# Patient Record
Sex: Female | Born: 2000 | Race: Black or African American | Hispanic: No | Marital: Single | Smoking: Never smoker
Health system: Southern US, Community
[De-identification: ages and names within clinical notes are randomized; demographics above are authoritative.]

## PROBLEM LIST (undated history)

## (undated) DIAGNOSIS — Z789 Other specified health status: Secondary | ICD-10-CM

## (undated) HISTORY — DX: Other specified health status: Z78.9

## (undated) HISTORY — PX: TONSILLECTOMY: SUR1361

---
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
## 2020-02-02 ENCOUNTER — Other Ambulatory Visit: Payer: Self-pay

## 2020-02-02 ENCOUNTER — Emergency Department: Payer: Managed Care, Other (non HMO)

## 2020-02-02 ENCOUNTER — Emergency Department
Admission: EM | Admit: 2020-02-02 | Discharge: 2020-02-02 | Disposition: A | Discharge status: Home / Self Care | Payer: Managed Care, Other (non HMO) | Attending: Emergency Medicine | Admitting: Emergency Medicine

## 2020-02-02 ENCOUNTER — Encounter: Payer: Self-pay | Admitting: Emergency Medicine

## 2020-02-02 DIAGNOSIS — R102 Pelvic and perineal pain: Secondary | ICD-10-CM | POA: Diagnosis present

## 2020-02-02 DIAGNOSIS — N83201 Unspecified ovarian cyst, right side: Secondary | ICD-10-CM | POA: Diagnosis not present

## 2020-02-02 DIAGNOSIS — R1011 Right upper quadrant pain: Secondary | ICD-10-CM | POA: Diagnosis not present

## 2020-02-02 DIAGNOSIS — R1010 Upper abdominal pain, unspecified: Secondary | ICD-10-CM

## 2020-02-02 DIAGNOSIS — N83209 Unspecified ovarian cyst, unspecified side: Secondary | ICD-10-CM

## 2020-02-02 LAB — COMPREHENSIVE METABOLIC PANEL
ALT: 25 U/L (ref 0–44)
AST: 22 U/L (ref 15–41)
Albumin: 4.1 g/dL (ref 3.5–5.0)
Alkaline Phosphatase: 54 U/L (ref 38–126)
Anion gap: 7 (ref 5–15)
BUN: 14 mg/dL (ref 6–20)
CO2: 22 mmol/L (ref 22–32)
Calcium: 8.6 mg/dL — ABNORMAL LOW (ref 8.9–10.3)
Chloride: 111 mmol/L (ref 98–111)
Creatinine, Ser: 0.81 mg/dL (ref 0.44–1.00)
GFR calc Af Amer: >60 mL/min (ref >60)
GFR calc non Af Amer: >60 mL/min (ref >60)
Glucose, Bld: 114 mg/dL — ABNORMAL HIGH (ref 70–99)
Potassium: 3.6 mmol/L (ref 3.5–5.1)
Sodium: 140 mmol/L (ref 135–145)
Total Bilirubin: 0.5 mg/dL (ref 0.3–1.2)
Total Protein: 7.1 g/dL (ref 6.5–8.1)

## 2020-02-02 LAB — URINALYSIS, COMPLETE (UACMP) WITH MICROSCOPIC
Bilirubin Urine: NEGATIVE
Glucose, UA: NEGATIVE mg/dL
Hgb urine dipstick: NEGATIVE
Ketones, ur: NEGATIVE mg/dL
Leukocytes,Ua: NEGATIVE
Nitrite: NEGATIVE
Protein, ur: 100 mg/dL — AB
Specific Gravity, Urine: 1.038 — ABNORMAL HIGH (ref 1.005–1.030)
Squamous Epithelial / LPF: NONE SEEN (ref 0–5)
pH: 5 (ref 5.0–8.0)

## 2020-02-02 LAB — CBC
HCT: 32.5 % — ABNORMAL LOW (ref 36.0–46.0)
Hemoglobin: 10.6 g/dL — ABNORMAL LOW (ref 12.0–15.0)
MCH: 29.1 pg (ref 26.0–34.0)
MCHC: 32.6 g/dL (ref 30.0–36.0)
MCV: 89.3 fL (ref 80.0–100.0)
Platelets: 313 10*3/uL (ref 150–400)
RBC: 3.64 MIL/uL — ABNORMAL LOW (ref 3.87–5.11)
RDW: 13.2 % (ref 11.5–15.5)
WBC: 7.8 10*3/uL (ref 4.0–10.5)
nRBC: 0 % (ref 0.0–0.2)

## 2020-02-02 LAB — LIPASE, BLOOD: Lipase: 20 U/L (ref 11–51)

## 2020-02-02 LAB — POCT PREGNANCY, URINE: Preg Test, Ur: NEGATIVE

## 2020-02-02 MED ORDER — ONDANSETRON HCL 4 MG/2ML IJ SOLN
4.0000 mg | Freq: Once | INTRAMUSCULAR | Status: AC
  Administered 2020-02-02: 4 mg via INTRAVENOUS
  Filled 2020-02-02: qty 2

## 2020-02-02 MED ORDER — HYDROCODONE-ACETAMINOPHEN 5-325 MG PO TABS: 1.0000 | ORAL_TABLET | Freq: Three times a day (TID) | ORAL | 0 refills | Status: AC | PRN

## 2020-02-02 MED ORDER — NAPROXEN 500 MG PO TABS: 500.0000 mg | ORAL_TABLET | Freq: Two times a day (BID) | ORAL | 2 refills | Status: DC

## 2020-02-02 MED ORDER — MORPHINE SULFATE (PF) 4 MG/ML IV SOLN
4.0000 mg | Freq: Once | INTRAVENOUS | Status: AC
  Administered 2020-02-02: 15:00:00 4 mg via INTRAVENOUS
  Filled 2020-02-02: qty 1

## 2020-02-02 MED ORDER — IOHEXOL 300 MG/ML  SOLN
100.0000 mL | Freq: Once | INTRAMUSCULAR | Status: AC | PRN
  Administered 2020-02-02: 15:00:00 100 mL via INTRAVENOUS
  Filled 2020-02-02: qty 100

## 2020-02-02 MED ORDER — SODIUM CHLORIDE 0.9 % IV SOLN
1000.0000 mL | Freq: Once | INTRAVENOUS | Status: AC
  Administered 2020-02-02: 15:00:00 1000 mL via INTRAVENOUS

## 2020-02-02 MED ORDER — KETOROLAC TROMETHAMINE 30 MG/ML IJ SOLN
30.0000 mg | Freq: Once | INTRAMUSCULAR | Status: AC
  Administered 2020-02-02: 14:00:00 30 mg via INTRAMUSCULAR
  Filled 2020-02-02: qty 1

## 2020-02-02 MED ORDER — SODIUM CHLORIDE 0.9% FLUSH: 3.0000 mL | Freq: Once | INTRAVENOUS | Status: DC

## 2020-02-02 NOTE — ED Triage Notes (Signed)
Abdominal pain since 0200.  Now c/o worsening pain with inspiration.

## 2020-02-02 NOTE — ED Notes (Signed)
Pt verbalized understanding of discharge instructions. NAD at this time. 

## 2020-02-02 NOTE — ED Provider Notes (Signed)
[facility] Emergency Department Provider Note   ____________________________________________    I have reviewed the triage vital signs and the nursing notes.   HISTORY  Chief Complaint Abdominal Pain     HPI Brooke Love is a 19 y.o. female who presents with complaints of abdominal pain.  Patient describes bilateral pelvic cramping discomfort which she describes as severe menstrual-like cramping, however not currently menstruating.  Then she developed pain in her upper abdomen.  The symptoms started over the last 1 to 2 days.  Denies fevers or chills.  No dysuria.  No vaginal discharge.  No vaginal bleeding.  Normal stools.  No sick contacts.  Has not take anything for this.  Is never had this before.  No history of abdominal surgeries.  Patient reports she was just checked for STDs 1 month ago, she has no concern about STDs  History reviewed. No pertinent past medical history.  There are no problems to display for this patient.   Past Surgical History:  Procedure Laterality Date  . TONSILLECTOMY      Prior to Admission medications   Medication Sig Start Date End Date Taking? Authorizing Provider  cyclobenzaprine (FLEXERIL) 5 MG tablet Take 2 tablets (10 mg total) by mouth 3 (three) times daily as needed for muscle spasms. 01/21/16   Anson Oregon, PA-C  HYDROcodone-acetaminophen (NORCO) 5-325 MG tablet Take 1 tablet by mouth 3 (three) times daily as needed for up to 2 days. 02/02/20 02/04/20  Menshew, Charlesetta Ivory, PA-C  naproxen (NAPROSYN) 500 MG tablet Take 1 tablet (500 mg total) by mouth 2 (two) times daily with a meal. 02/02/20   Jene Every, MD     Allergies Amoxicillin  No family history on file.  Social History Social History   Tobacco Use  . Smoking status: Never Smoker  . Smokeless tobacco: Never Used  Substance Use Topics  . Alcohol use: No  . Drug use: Not on file    Review of Systems  Constitutional: No  fever/chills Eyes: No visual changes.  ENT: No sore throat. Cardiovascular: Denies chest pain. Respiratory: Denies shortness of breath. Gastrointestinal: As above Genitourinary: As above Musculoskeletal: Negative for back pain. Skin: Negative for rash. Neurological: Negative for headaches or weakness   ____________________________________________   PHYSICAL EXAM:  VITAL SIGNS: ED Triage Vitals  Enc Vitals Group     BP 02/02/20 1004 132/83     Pulse Rate 02/02/20 1004 71     Resp 02/02/20 1004 18     Temp 02/02/20 1004 98.3 F (36.8 C)     Temp Source 02/02/20 1004 Oral     SpO2 02/02/20 1004 98 %     Weight 02/02/20 1002 65.8 kg (145 lb 1 oz)     Height 02/02/20 1002 1.499 m (4\' 11" )     Head Circumference --      Peak Flow --      Pain Score 02/02/20 1002 7     Pain Loc --      Pain Edu? --      Excl. in GC? --     Constitutional: Alert and oriented. e  Nose: No congestion/rhinnorhea. Mouth/Throat: Mucous membranes are moist.    Cardiovascular: Normal rate, regular rhythm. Grossly normal heart sounds.   Respiratory: Normal respiratory effort.  No retractions. Lungs CTAB. Gastrointestinal: Soft, tenderness over the right upper quadrant, no distention.  No CVA tenderness  Musculoskeletal: No lower extremity tenderness nor edema.  Warm and well  perfused Neurologic:  Normal speech and language. No gross focal neurologic deficits are appreciated.  Skin:  Skin is warm, dry and intact. No rash noted. Psychiatric: Mood and affect are normal. Speech and behavior are normal.  ____________________________________________   LABS (all labs ordered are listed, but only abnormal results are displayed)  Labs Reviewed  COMPREHENSIVE METABOLIC PANEL - Abnormal; Notable for the following components:      Result Value   Glucose, Bld 114 (*)    Calcium 8.6 (*)    All other components within normal limits  CBC - Abnormal; Notable for the following components:   RBC 3.64 (*)     Hemoglobin 10.6 (*)    HCT 32.5 (*)    All other components within normal limits  URINALYSIS, COMPLETE (UACMP) WITH MICROSCOPIC - Abnormal; Notable for the following components:   Color, Urine YELLOW (*)    APPearance TURBID (*)    Specific Gravity, Urine 1.038 (*)    Protein, ur 100 (*)    Bacteria, UA MANY (*)    All other components within normal limits  LIPASE, BLOOD  POC URINE PREG, ED  POCT PREGNANCY, URINE   ____________________________________________  EKG  None ____________________________________________  RADIOLOGY  Ultrasound abdomen of the right upper quadrant demonstrates small amount of free fluid noted around the liver, gallbladder normal CT scan consistent with ruptured hemorrhagic cyst, small amount of hemoperitoneum ____________________________________________   PROCEDURES  Procedure(s) performed: No  Procedures   Critical Care performed: No ____________________________________________   INITIAL IMPRESSION / ASSESSMENT AND PLAN / ED COURSE  Pertinent labs & imaging results that were available during my care of the patient were reviewed by me and considered in my medical decision making (see chart for details).  Patient presents with pain as described above.  On exam primarily tender over the right upper quadrant, cholelithiasis/cholecystitis is a possibility although she is somewhat young for that.  No dysuria or vaginal discharge however Rochele Raring on the differential.  We will start with ultrasound of the upper abdomen, treated with IM Toradol.  Ultrasound demonstrates small amount of free fluid noted on the liver.  Rochele Raring remains on the differential but also ruptured ovarian cyst.  Will send for CT abdomen pelvis.  Vital signs stable.  IV morphine and IV Zofran given for worsening pain.  CT scan consistent with ruptured ovarian cyst, small amount of hemoperitoneum, patient feeling much better, vital signs remained stable.   Discussed with Dr. Marcelline Mates of gynecology who recommends close outpatient follow-up in her office, discussed with patient and her mother who agree with this plan.  They know to return to the emergency department if any worsening symptoms, dizziness lightheadedness or worsening abdominal pain.    ____________________________________________   FINAL CLINICAL IMPRESSION(S) / ED DIAGNOSES  Final diagnoses:  Upper abdominal pain  Ruptured ovarian cyst        Note:  This document was prepared using Dragon voice recognition software and may include unintentional dictation errors.   Lavonia Drafts, MD 02/02/20 1630

## 2020-02-02 NOTE — ED Notes (Signed)
Pt to ct 

## 2020-02-04 ENCOUNTER — Other Ambulatory Visit: Payer: Self-pay

## 2020-02-04 ENCOUNTER — Ambulatory Visit (INDEPENDENT_AMBULATORY_CARE_PROVIDER_SITE_OTHER): Payer: Managed Care, Other (non HMO) | Admitting: Obstetrics and Gynecology

## 2020-02-04 ENCOUNTER — Encounter: Payer: Self-pay | Admitting: Obstetrics and Gynecology

## 2020-02-04 VITALS — BP 130/81 | HR 91 | Ht 59.0 in | Wt 170.1 lb

## 2020-02-04 DIAGNOSIS — N83209 Unspecified ovarian cyst, unspecified side: Secondary | ICD-10-CM

## 2020-02-04 DIAGNOSIS — D500 Iron deficiency anemia secondary to blood loss (chronic): Secondary | ICD-10-CM

## 2020-02-04 NOTE — Progress Notes (Signed)
GYNECOLOGY PROGRESS NOTE  Subjective:    Patient ID: Brooke Love, female    DOB: 01/03/2001, 19 y.o.   MRN: [id number]  HPI  Patient is a 19 y.o. G0P0000 female who presents for follow up of Emergency Room visit (02/02/2020) for complaints of abdominal pain x 1-2 days.  She was diagnosed with a ruptured ovarian cyst.  Patient states that at that time she had not taken anything for the pain.  She does not have a prior history of ovarian cysts in the pasts.  Reports regular menses.  Since her visit, she notes that the pain has improved, only having to take OTC meds as needed.    Past Medical History:  Diagnosis Date  . No pertinent past medical history     Family History  Problem Relation Age of Onset  . Healthy Mother   . Healthy Father   . Cancer Maternal Grandmother   . Breast cancer Maternal Grandmother     Past Surgical History:  Procedure Laterality Date  . TONSILLECTOMY      Social History   Socioeconomic History  . Marital status: Single    Spouse name: Not on file  . Number of children: Not on file  . Years of education: Not on file  . Highest education level: Not on file  Occupational History  . Not on file  Tobacco Use  . Smoking status: Never Smoker  . Smokeless tobacco: Never Used  Substance and Sexual Activity  . Alcohol use: Yes    Comment: occass  . Drug use: Not Currently    Types: Marijuana  . Sexual activity: Yes    Birth control/protection: Condom  Other Topics Concern  . Not on file  Social History Narrative  . Not on file   Social Determinants of Health   Financial Resource Strain:   . Difficulty of Paying Living Expenses:   Food Insecurity:   . Worried About Programme researcher, broadcasting/film/video in the Last Year:   . Barista in the Last Year:   Transportation Needs:   . Freight forwarder (Medical):   Marland Kitchen Lack of Transportation (Non-Medical):   Physical Activity:   . Days of Exercise per Week:   . Minutes of Exercise per Session:     Stress:   . Feeling of Stress :   Social Connections:   . Frequency of Communication with Friends and Family:   . Frequency of Social Gatherings with Friends and Family:   . Attends Religious Services:   . Active Member of Clubs or Organizations:   . Attends Banker Meetings:   Marland Kitchen Marital Status:   Intimate Partner Violence:   . Fear of Current or Ex-Partner:   . Emotionally Abused:   Marland Kitchen Physically Abused:   . Sexually Abused:     Current Outpatient Medications on File Prior to Visit  Medication Sig Dispense Refill  . naproxen (NAPROSYN) 500 MG tablet Take 1 tablet (500 mg total) by mouth 2 (two) times daily with a meal. 20 tablet 2   No current facility-administered medications on file prior to visit.    Allergies  Allergen Reactions  . Amoxicillin Rash     Review of Systems Constitutional: negative for chills, fatigue, fevers and sweats Eyes: negative for irritation, redness and visual disturbance Ears, nose, mouth, throat, and face: negative for hearing loss, nasal congestion, snoring and tinnitus Respiratory: negative for asthma, cough, sputum Cardiovascular: negative for chest pain, dyspnea, exertional chest  pressure/discomfort, irregular heart beat, palpitations and syncope Gastrointestinal: positive for abdominal pain (resolving).  Negative for change in bowel habits, nausea and vomiting Genitourinary: negative for abnormal menstrual periods, genital lesions, sexual problems and vaginal discharge, dysuria and urinary incontinence Integument/breast: negative for breast lump, breast tenderness and nipple discharge Hematologic/lymphatic: negative for bleeding and easy bruising Musculoskeletal:negative for back pain and muscle weakness Neurological: negative for dizziness, headaches, vertigo and weakness Endocrine: negative for diabetic symptoms including polydipsia, polyuria and skin dryness Allergic/Immunologic: negative for hay fever and urticaria       Objective:   Blood pressure 130/81, pulse 91, height 4\' 11"  (1.499 m), weight 170 lb 1.6 oz (77.2 kg), last menstrual period 02/03/2020. General appearance: alert and no distress  CVS: S1 and S2 normal. Regular rate and rhythm. Lungs: CTAB  Abdomen: soft, non-tender; bowel sounds normal; no masses,  no organomegaly Pelvic: deferred.  Extremities: extremities normal, atraumatic, no cyanosis or edema Neurologic: Grossly normal    Labs:  Admission on 02/02/2020, Discharged on 02/02/2020  Component Date Value Ref Range Status  . Lipase 02/02/2020 20  11 - 51 U/L Final   Performed at [facility], [address] [place] [place], [place]  . Sodium 02/02/2020 140  135 - 145 mmol/L Final  . Potassium 02/02/2020 3.6  3.5 - 5.1 mmol/L Final  . Chloride 02/02/2020 111  98 - 111 mmol/L Final  . CO2 02/02/2020 22  22 - 32 mmol/L Final  . Glucose, Bld 02/02/2020 114* 70 - 99 mg/dL Final   Glucose reference range applies only to samples taken after fasting for at least 8 hours.  . BUN 02/02/2020 14  6 - 20 mg/dL Final  . Creatinine, Ser 02/02/2020 0.81  0.44 - 1.00 mg/dL Final  . Calcium 02/04/2020 8.6* 8.9 - 10.3 mg/dL Final  . Total Protein 02/02/2020 7.1  6.5 - 8.1 g/dL Final  . Albumin 02/04/2020 4.1  3.5 - 5.0 g/dL Final  . AST 60/07/9322 22  15 - 41 U/L Final  . ALT 02/02/2020 25  0 - 44 U/L Final  . Alkaline Phosphatase 02/02/2020 54  38 - 126 U/L Final  . Total Bilirubin 02/02/2020 0.5  0.3 - 1.2 mg/dL Final  . GFR calc non Af Amer 02/02/2020 >60  >60 mL/min Final  . GFR calc Af Amer 02/02/2020 >60  >60 mL/min Final  . Anion gap 02/02/2020 7  5 - 15 Final   Performed at [facility], [address], [place], [place]  . WBC 02/02/2020 7.8  4.0 - 10.5 K/uL Final  . RBC 02/02/2020 3.64* 3.87 - 5.11 MIL/uL Final  . Hemoglobin 02/02/2020 10.6* 12.0 - 15.0 g/dL Final  . HCT 02/04/2020 32.5* 36.0 - 46.0 % Final  . MCV 02/02/2020 89.3  80.0 - 100.0 fL  Final  . MCH 02/02/2020 29.1  26.0 - 34.0 pg Final  . MCHC 02/02/2020 32.6  30.0 - 36.0 g/dL Final  . RDW 02/04/2020 13.2  11.5 - 15.5 % Final  . Platelets 02/02/2020 313  150 - 400 K/uL Final  . nRBC 02/02/2020 0.0  0.0 - 0.2 % Final   Performed at [facility], [address] [place], [place]  . Color, Urine 02/02/2020 YELLOW* YELLOW Final  . APPearance 02/02/2020 TURBID* CLEAR Final  . Specific Gravity, Urine 02/02/2020 1.038* 1.005 - 1.030 Final  . pH 02/02/2020 5.0  5.0 - 8.0 Final  . Glucose, UA 02/02/2020 NEGATIVE  NEGATIVE mg/dL Final  .  Hgb urine dipstick 02/02/2020 NEGATIVE  NEGATIVE Final  . Bilirubin Urine 02/02/2020 NEGATIVE  NEGATIVE Final  . Ketones, ur 02/02/2020 NEGATIVE  NEGATIVE mg/dL Final  . Protein, ur 22/97/9892 100* NEGATIVE mg/dL Final  . Nitrite 11/94/1740 NEGATIVE  NEGATIVE Final  . Glori Luis 02/02/2020 NEGATIVE  NEGATIVE Final  . RBC / HPF 02/02/2020 6-10  0 - 5 RBC/hpf Final  . WBC, UA 02/02/2020 6-10  0 - 5 WBC/hpf Final  . Bacteria, UA 02/02/2020 MANY* NONE SEEN Final  . Squamous Epithelial / LPF 02/02/2020 NONE SEEN  0 - 5 Final  . Mucus 02/02/2020 PRESENT   Final   Performed at [facility], [address], [place], [place] [address]  . Preg Test, Ur 02/02/2020 NEGATIVE  NEGATIVE Final   Comment:        THE SENSITIVITY OF THIS METHODOLOGY IS >24 mIU/mL     Imaging:  CT ABDOMEN PELVIS W CONTRAST CLINICAL DATA:  Acute right upper quadrant abdominal pain.  EXAM: CT ABDOMEN AND PELVIS WITH CONTRAST  TECHNIQUE: Multidetector CT imaging of the abdomen and pelvis was performed using the standard protocol following bolus administration of intravenous contrast.  CONTRAST:  OMNIPAQUE IOHEXOL 300 MG/ML  SOLN  COMPARISON:  Right upper quadrant ultrasound from same day.  FINDINGS: Lower chest: No acute abnormality.  Hepatobiliary: No focal liver abnormality is seen. No gallstones, gallbladder wall  thickening, or biliary dilatation.  Pancreas: Unremarkable. No pancreatic ductal dilatation or surrounding inflammatory changes.  Spleen: Normal in size without focal abnormality.  Adrenals/Urinary Tract: Adrenal glands are unremarkable. Kidneys are normal, without renal calculi, focal lesion, or hydronephrosis. Bladder is unremarkable.  Stomach/Bowel: The stomach is within normal limits. Mild wall thickening of a few small bowel loops in the pelvis with fecalization. No obstruction. Normal appendix.  Vascular/Lymphatic: No significant vascular findings are present. No enlarged abdominal or pelvic lymph nodes.  Reproductive: 3.5 x 2.9 x 5.8 cm right ovarian complex cystic lesion containing high density material. The uterus and left ovary are unremarkable.  Other: Small amount of high-density fluid in the pelvis and around the liver and spleen. No pneumoperitoneum.  Musculoskeletal: No acute or significant osseous findings.  IMPRESSION: 1. 5.8 cm complex cystic lesion in the right ovary containing high density material, with small amount of hemoperitoneum in the pelvis and around the liver and spleen, consistent with ruptured hemorrhagic cyst. 2. Reactive small bowel enteritis and stasis in the pelvis adjacent to the ruptured ovarian cyst.  Electronically Signed   By: Obie Dredge M.D.   On: 02/02/2020 15:46 US Abdomen Limited RUQ CLINICAL DATA:  Acute upper abdominal pain.  EXAM: ULTRASOUND ABDOMEN LIMITED RIGHT UPPER QUADRANT  COMPARISON:  None.  FINDINGS: Gallbladder:  No gallstones or wall thickening visualized. No sonographic Murphy sign noted by sonographer.  Common bile duct:  Diameter: 3 mm which is within normal limits.  Liver:  No focal lesion identified. Within normal limits in parenchymal echogenicity. Portal vein is patent on color Doppler imaging with normal direction of blood flow towards the liver.  Other: Small amount of free fluid is  noted around the liver.  IMPRESSION: Small amount of free fluid is noted around the liver. No other abnormality is noted in the right upper quadrant of the abdomen.  Electronically Signed   By: Lupita Raider M.D.   On: 02/02/2020 14:29   Assessment:   Ruptured right hemorrhagic cyst Anemia   Plan:   - Patient overall noting improvement in her symptoms,  has been doing well since ER visit 2 days ago.  Her Hemoglobin was borderline low, unsure if it is due to acute blood loss from ruptured hemorrhagic cyst or if she has a baseline history of anemia (as no previous CBC noted in the system) due to menses.  Unable to repeat CBC today as patient had late arrival for appointment and lab technician gone for the day. Patient overall appears stable with improving symptoms, so less concerned about continued blood loss. Can continue OTC pain meds as needed.  - Review of chart notes that patient has actually been seen by [facility] and was not actually unassigned. Advised that she can f/u with them for any future issues, or can continue to be seen at [facility] if she desires.    Rubie Maid, MD [facility]

## 2020-02-04 NOTE — Patient Instructions (Addendum)
Ovarian Cyst     An ovarian cyst is a fluid-filled sac that forms on an ovary. The ovaries are small organs that produce eggs in women. Various types of cysts can form on the ovaries. Some may cause symptoms and require treatment. Most ovarian cysts go away on their own, are not cancerous (are benign), and do not cause problems. Common types of ovarian cysts include:  Functional (follicle) cysts. ? Occur during the menstrual cycle, and usually go away with the next menstrual cycle if you do not get pregnant. ? Usually cause no symptoms.  Endometriomas. ? Are cysts that form from the tissue that lines the uterus (endometrium). ? Are sometimes called "chocolate cysts" because they become filled with blood that turns brown. ? Can cause pain in the lower abdomen during intercourse and during your period.  Cystadenoma cysts. ? Develop from cells on the outside surface of the ovary. ? Can get very large and cause lower abdomen pain and pain with intercourse. ? Can cause severe pain if they twist or break open (rupture).  Dermoid cysts. ? Are sometimes found in both ovaries. ? May contain different kinds of body tissue, such as skin, teeth, hair, or cartilage. ? Usually do not cause symptoms unless they get very big.  Theca lutein cysts. ? Occur when too much of a certain hormone (human chorionic gonadotropin) is produced and overstimulates the ovaries to produce an egg. ? Are most common after having procedures used to assist with the conception of a baby (in vitro fertilization). What are the causes? Ovarian cysts may be caused by:  Ovarian hyperstimulation syndrome. This is a condition that can develop from taking fertility medicines. It causes multiple large ovarian cysts to form.  Polycystic ovarian syndrome (PCOS). This is a common hormonal disorder that can cause ovarian cysts, as well as problems with your period or fertility. What increases the risk? The following factors may  make you more likely to develop ovarian cysts:  Being overweight or obese.  Taking fertility medicines.  Taking certain forms of hormonal birth control.  Smoking. What are the signs or symptoms? Many ovarian cysts do not cause symptoms. If symptoms are present, they may include:  Pelvic pain or pressure.  Pain in the lower abdomen.  Pain during sex.  Abdominal swelling.  Abnormal menstrual periods.  Increasing pain with menstrual periods. How is this diagnosed? These cysts are commonly found during a routine pelvic exam. You may have tests to find out more about the cyst, such as:  Ultrasound.  X-ray of the pelvis.  CT scan.  MRI.  Blood tests. How is this treated? Many ovarian cysts go away on their own without treatment. Your health care provider may want to check your cyst regularly for 2-3 months to see if it changes. If you are in menopause, it is especially important to have your cyst monitored closely because menopausal women have a higher rate of ovarian cancer. When treatment is needed, it may include:  Medicines to help relieve pain.  A procedure to drain the cyst (aspiration).  Surgery to remove the whole cyst.  Hormone treatment or birth control pills. These methods are sometimes used to help dissolve a cyst. Follow these instructions at home:  Take over-the-counter and prescription medicines only as told by your health care provider.  Do not drive or use heavy machinery while taking prescription pain medicine.  Get regular pelvic exams and Pap tests as often as told by your health care provider.    Return to your normal activities as told by your health care provider. Ask your health care provider what activities are safe for you.  Do not use any products that contain nicotine or tobacco, such as cigarettes and e-cigarettes. If you need help quitting, ask your health care provider.  Keep all follow-up visits as told by your health care provider.  This is important. Contact a health care provider if:  Your periods are late, irregular, or painful, or they stop.  You have pelvic pain that does not go away.  You have pressure on your bladder or trouble emptying your bladder completely.  You have pain during sex.  You have any of the following in your abdomen: ? A feeling of fullness. ? Pressure. ? Discomfort. ? Pain that does not go away. ? Swelling.  You feel generally ill.  You become constipated.  You lose your appetite.  You develop severe acne.  You start to have more body hair and facial hair.  You are gaining weight or losing weight without changing your exercise and eating habits.  You think you may be pregnant. Get help right away if:  You have abdominal pain that is severe or gets worse.  You cannot eat or drink without vomiting.  You suddenly develop a fever.  Your menstrual period is much heavier than usual. This information is not intended to replace advice given to you by your health care provider. Make sure you discuss any questions you have with your health care provider. Document Revised: 01/06/2018 Document Reviewed: 03/11/2016 Elsevier Patient Education  2020 [employer].  Ovarian Cyst An ovarian cyst is a fluid-filled sac on an ovary. The ovaries are organs that make eggs in women. Most ovarian cysts go away on their own and are not cancerous (are benign). Some cysts need treatment. Follow these instructions at home:  Take over-the-counter and prescription medicines only as told by your doctor.  Do not drive or use heavy machinery while taking prescription pain medicine.  Get pelvic exams and Pap tests as often as told by your doctor.  Return to your normal activities as told by your doctor. Ask your doctor what activities are safe for you.  Do not use any products that contain nicotine or tobacco, such as cigarettes and e-cigarettes. If you need help quitting, ask your doctor.  Keep  all follow-up visits as told by your doctor. This is important. Contact a doctor if:  Your periods are: ? Late. ? Irregular. ? Painful.   Your periods stop.  You have pelvic pain that does not go away.  You have pressure on your bladder.  You have trouble making your bladder empty when you pee (urinate).  You have pain during sex.  You have any of the following in your belly (abdomen): ? A feeling of fullness. ? Pressure. ? Discomfort. ? Pain that does not go away. ? Swelling.  You feel sick most of the time.  You have trouble pooping (have constipation).  You are not as hungry as usual (you lose your appetite).  You get very bad acne.  You start to have more hair on your body and face.  You are gaining weight or losing weight without changing your exercise and eating habits.  You think you may be pregnant. Get help right away if:  You have belly pain that is very bad or gets worse.  You cannot eat or drink without throwing up (vomiting).  You suddenly get a fever.  Your period  is a lot heavier than usual. This information is not intended to replace advice given to you by your health care provider. Make sure you discuss any questions you have with your health care provider. Document Revised: 09/20/2017 Document Reviewed: 03/11/2016 Elsevier Patient Education  2020 [employer].

## 2020-02-04 NOTE — Progress Notes (Signed)
Pt present for ED follow up after 3 days of ovarian cyst pain. Pt's LMP 02/03/2020.

## 2020-02-08 ENCOUNTER — Encounter: Payer: Self-pay | Admitting: Obstetrics and Gynecology

## 2021-02-20 ENCOUNTER — Telehealth: Payer: Self-pay | Admitting: Obstetrics and Gynecology

## 2021-02-20 NOTE — Telephone Encounter (Signed)
Pt's mother called to schedule appointment states that her periods has been going on for 2 weeks, I gave Dr.Cherry's first available appoint that was this Thursday at 05-05 @ 2:45 - pt's mother made apt but wanted to know if it was okay to wait til later in week or needs to be seen sooner. Please Advise.

## 2021-02-21 NOTE — Telephone Encounter (Signed)
Spoke to pt concerning her call to the office. Pt stated that she was still having issues and is okay to wait until Thursday 02/23/2021 to be seen. Pt is aware that if anyone cancels that I would reach out to her to see if she can come in. Pt voiced that she understood.

## 2021-02-23 ENCOUNTER — Encounter: Payer: Self-pay | Admitting: Obstetrics and Gynecology

## 2021-02-23 ENCOUNTER — Ambulatory Visit (INDEPENDENT_AMBULATORY_CARE_PROVIDER_SITE_OTHER): Payer: Managed Care, Other (non HMO) | Admitting: Obstetrics and Gynecology

## 2021-02-23 ENCOUNTER — Other Ambulatory Visit: Payer: Self-pay

## 2021-02-23 VITALS — BP 137/83 | HR 92 | Ht 59.0 in | Wt 172.8 lb

## 2021-02-23 DIAGNOSIS — N921 Excessive and frequent menstruation with irregular cycle: Secondary | ICD-10-CM

## 2021-02-23 LAB — POCT URINE PREGNANCY: Preg Test, Ur: NEGATIVE

## 2021-02-23 MED ORDER — MEDROXYPROGESTERONE ACETATE 10 MG PO TABS: 10.0000 mg | ORAL_TABLET | Freq: Every day | ORAL | 0 refills | Status: AC

## 2021-02-23 NOTE — Patient Instructions (Signed)
Dysfunctional Uterine Bleeding Dysfunctional uterine bleeding is abnormal bleeding from the uterus. Dysfunctional uterine bleeding includes:  A menstrual period that comes earlier or later than usual.  A menstrual period that is lighter or heavier than usual, or has large blood clots.  Vaginal bleeding between menstrual periods.  Skipping one or more menstrual periods.  Vaginal bleeding after sex.  Vaginal bleeding after menopause. Follow these instructions at home: Eating and drinking  Eat well-balanced meals. Include foods that are high in iron, such as liver, meat, shellfish, green leafy vegetables, and eggs.  To prevent or treat constipation, your health care provider may recommend that you: ? Drink enough fluid to keep your urine pale yellow. ? Take over-the-counter or prescription medicines. ? Eat foods that are high in fiber, such as beans, whole grains, and fresh fruits and vegetables. ? Limit foods that are high in fat and processed sugars, such as fried or sweet foods.   Medicines  Take over-the-counter and prescription medicines only as told by your health care provider.  Do not change medicines without talking with your health care provider.  Aspirin or medicines that contain aspirin may make the bleeding worse. Do not take those medicines: ? During the week before your menstrual period. ? During your menstrual period.  If you were prescribed iron pills, take them as told by your health care provider. Iron pills help to replace iron that your body loses because of this condition. Activity  If you need to change your sanitary pad or tampon more than one time every 2 hours: ? Lie in bed with your feet raised (elevated). ? Place a cold pack on your lower abdomen. ? Rest as much as possible until the bleeding stops or slows down.  Do not try to lose weight until the bleeding has stopped and your blood iron level is back to normal. General instructions  For two  months, write down: ? When your menstrual period starts. ? When your menstrual period ends. ? When any abnormal vaginal bleeding occurs. ? What problems you notice.  Keep all follow up visits as told by your health care provider. This is important.   Contact a health care provider if you:  Feel light-headed or weak.  Have nausea and vomiting.  Cannot eat or drink without vomiting.  Feel dizzy or have diarrhea while you are taking medicines.  Are taking birth control pills or hormones, and you want to change them or stop taking them. Get help right away if:  You develop a fever or chills.  You need to change your sanitary pad or tampon more than one time per hour.  Your vaginal bleeding becomes heavier, or your flow contains clots more often.  You develop pain in your abdomen.  You lose consciousness.  You develop a rash. Summary  Dysfunctional uterine bleeding is abnormal bleeding from the uterus.  It includes menstrual bleeding of abnormal duration, volume, or regularity.  Bleeding after sex and after menopause are also considered dysfunctional uterine bleeding. This information is not intended to replace advice given to you by your health care provider. Make sure you discuss any questions you have with your health care provider. Document Revised: 03/19/2018 Document Reviewed: 03/19/2018 Elsevier Patient Education  2021 [employer]  

## 2021-02-23 NOTE — Progress Notes (Signed)
Pt present due to having irregular prolonged cycles. Pt stated her last cycle started 01/30/2021 and still present. Pt stated having light cycles with clots.

## 2021-02-23 NOTE — Progress Notes (Signed)
    GYNECOLOGY PROGRESS NOTE  Subjective:    Patient ID: Brooke Love, female    DOB: [birth year], 20 y.o.   MRN: [id number]  HPI  Patient is a 20 y.o. G0P0000 female who presents for complaints of prolonged menstrual cycle.  Notes that her current cycle has been on for the past 2 weeks, with associated clots. Has never had any issues with her cycles in the past.  She is currently sexually active, uses condoms for contraception. Denies any recent issues with condom breakage. Denies vaginal discharge or pelvic pain/cramping. Also denies any dizziness, SOB.  She does report an increase in stress levels this week as she is taking final exams.   The following portions of the patient's history were reviewed and updated as appropriate: allergies, current medications, past family history, past medical history, past social history, past surgical history and problem list.  Review of Systems Pertinent items noted in HPI and remainder of comprehensive ROS otherwise negative.   Objective:   Blood pressure 137/83, pulse 92, height 4\' 11"  (1.499 m), weight 172 lb 12.8 oz (78.4 kg), last menstrual period 01/30/2021. General appearance: alert and no distress Abdomen: soft, non-tender; bowel sounds normal; no masses,  no organomegaly Pelvic: deferred   Labs:   , Results for orders placed or performed in visit on 02/23/21  POCT urine pregnancy  Result Value Ref Range   Preg Test, Ur Negative Negative    Assessment:   Prolonged menstrual cycle  Plan:   Unclear cause of prolonged menses. Is first occurrence. Discussed differential including stress, dietary changes, hormonal changes, etc.  Discussed stress relief. Reports that today her cycle feels like it may be slowing down. Advised that we can perform expectant management for now, or can treat with progesterone or 1 month of OCPs. Patient ok for expectant management but requests progesterone prescription in case bleeding persists or worsens.    04/25/21, MD [facility]

## 2021-08-26 IMAGING — US US ABDOMEN LIMITED
1 series · 14 of 25 positions shown · non-contrast
Comparison: None.

CLINICAL DATA: Acute upper abdominal pain.

EXAM:
ULTRASOUND ABDOMEN LIMITED RIGHT UPPER QUADRANT

[Series 1: us abdomen limited ruq · 14 of 37 slices shown]
[im 1/37]
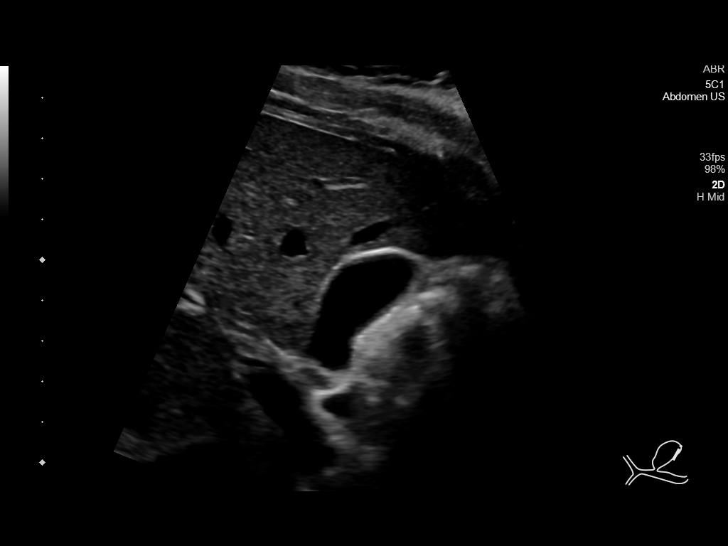
[im 4/37]
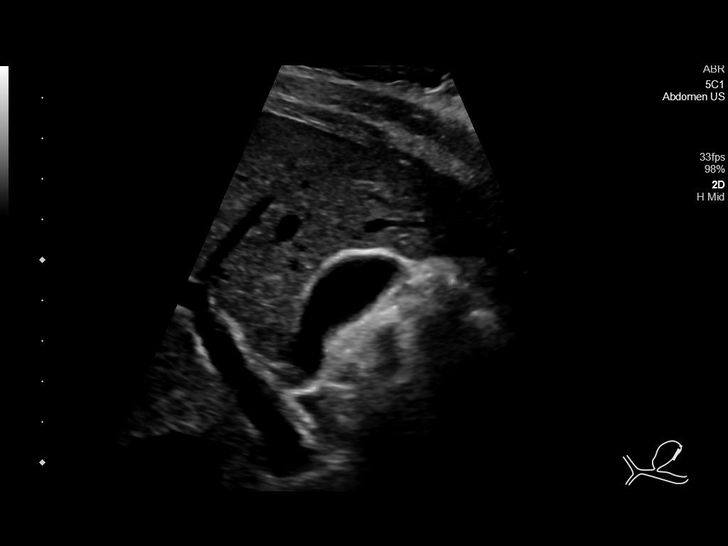
[im 7/37]
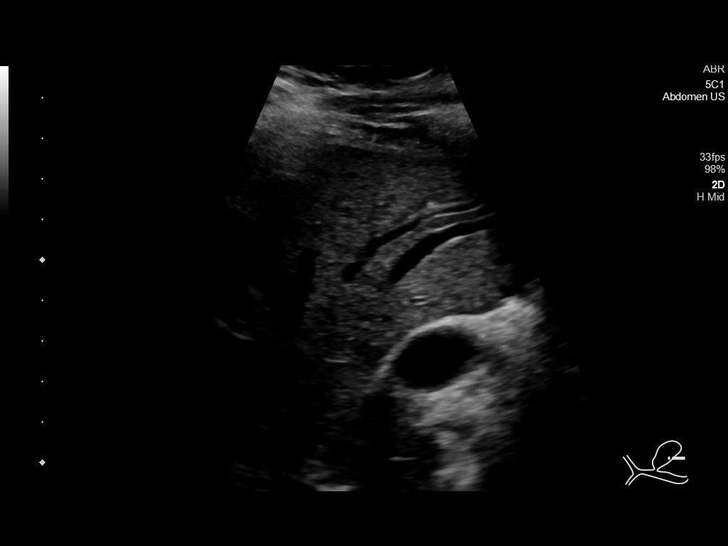
[im 10/37]
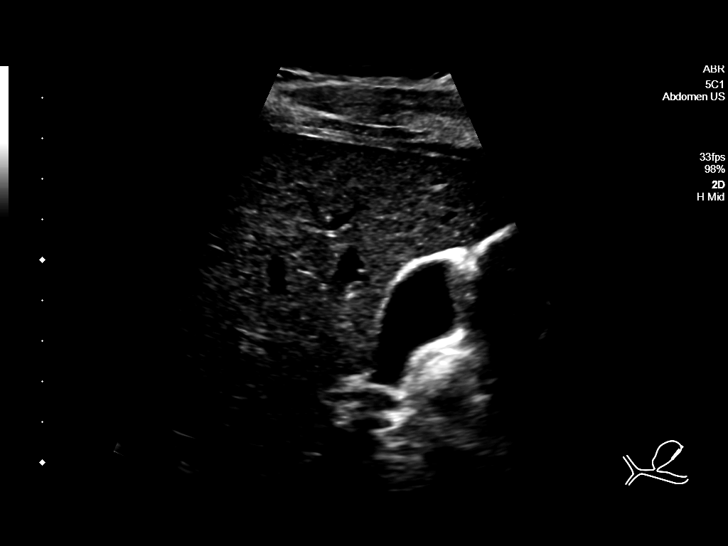
[im 13/37]
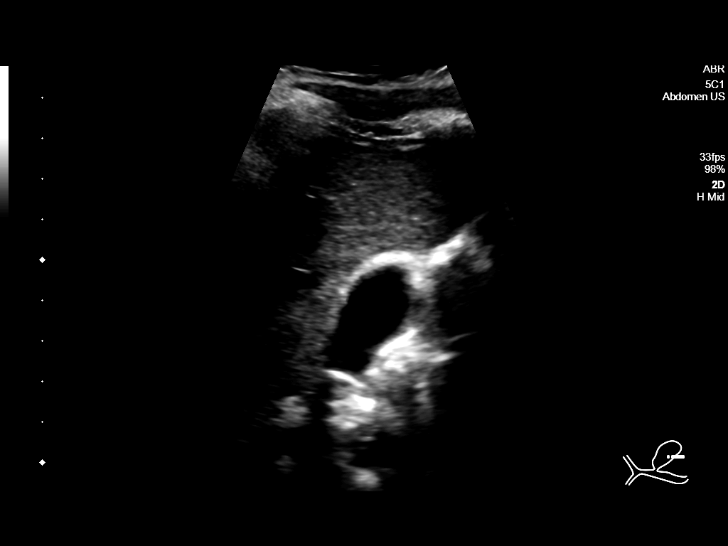
[im 14/37]
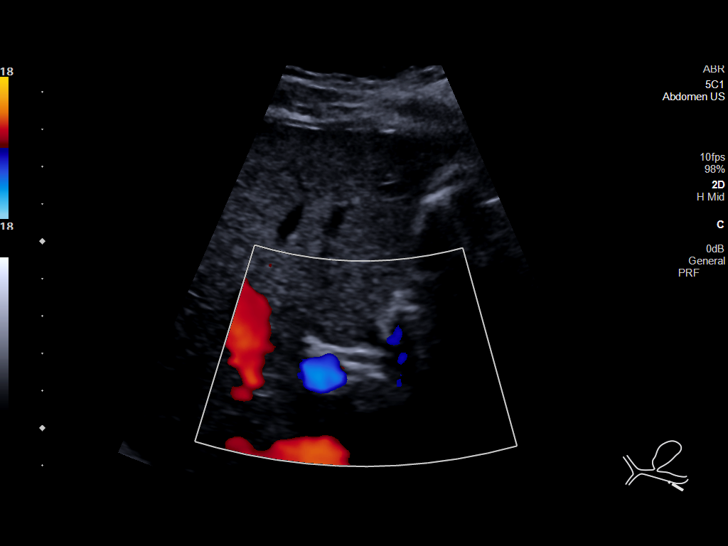
[im 17/37]
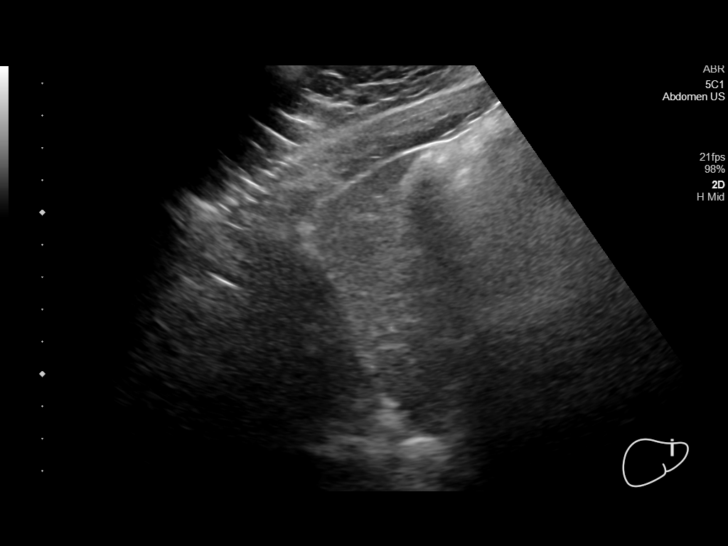
[im 20/37]
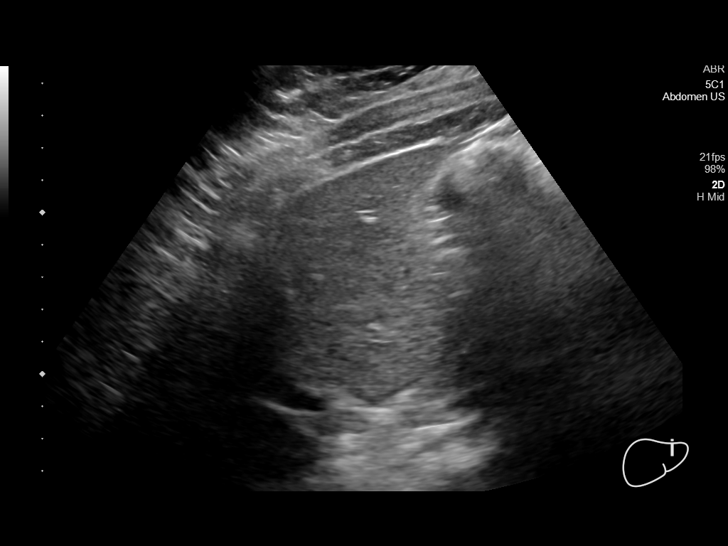
[im 23/37]
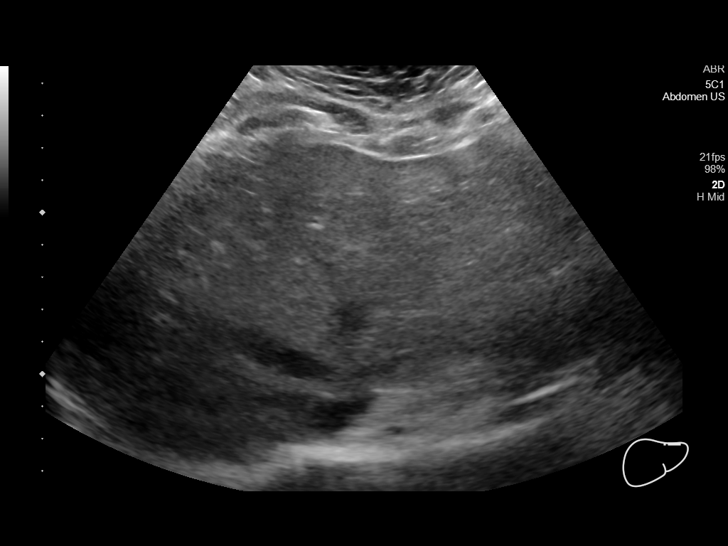
[im 25/37]
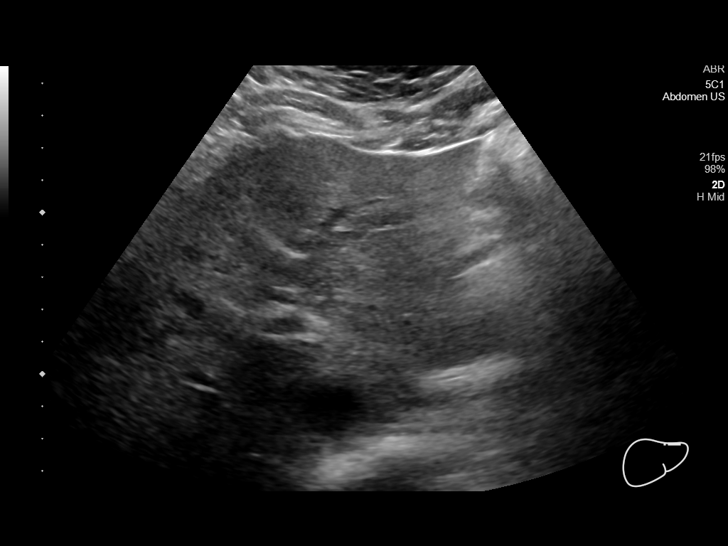
[im 28/37]
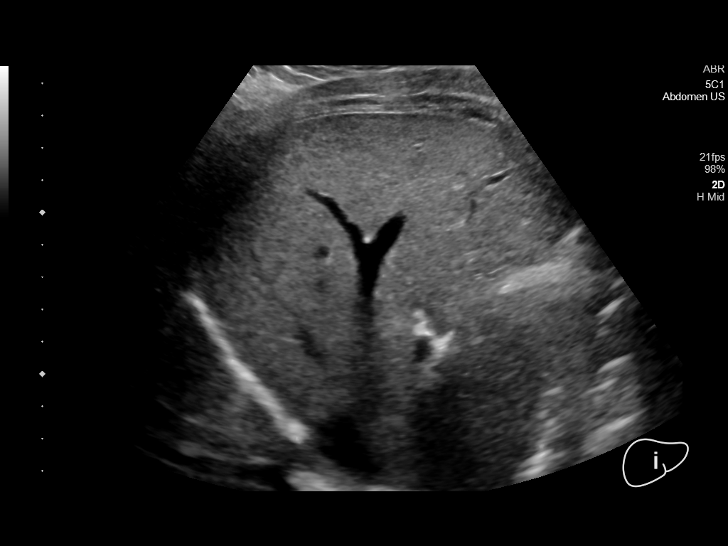
[im 31/37]
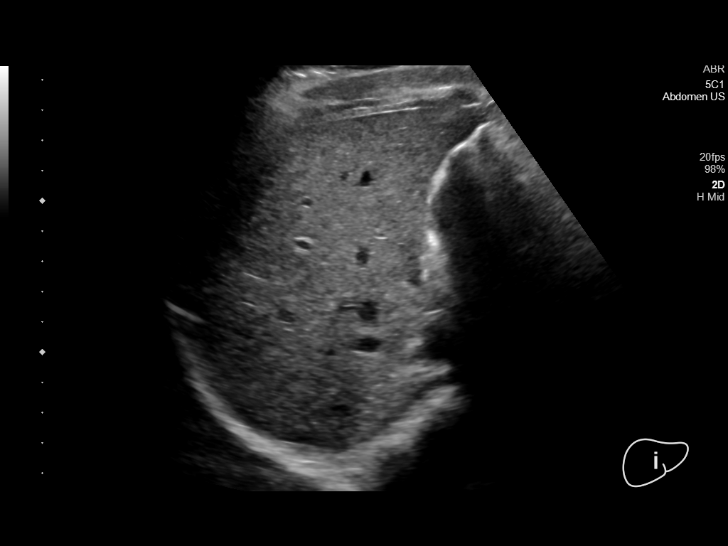
[im 34/37]
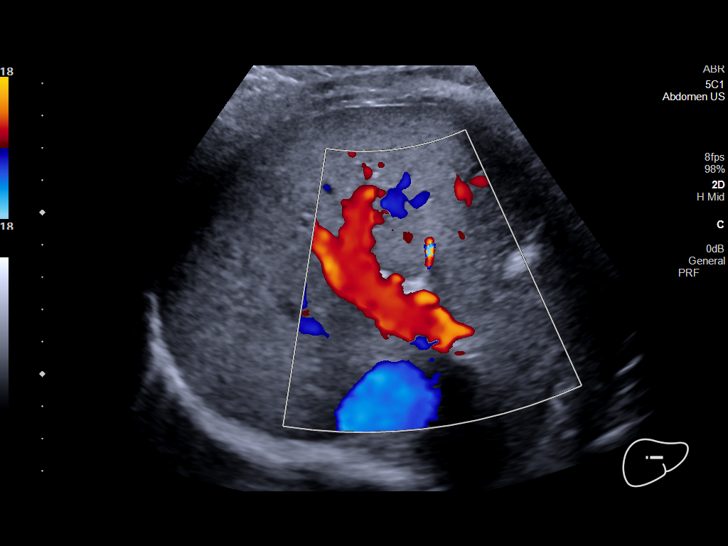
[im 37/37]
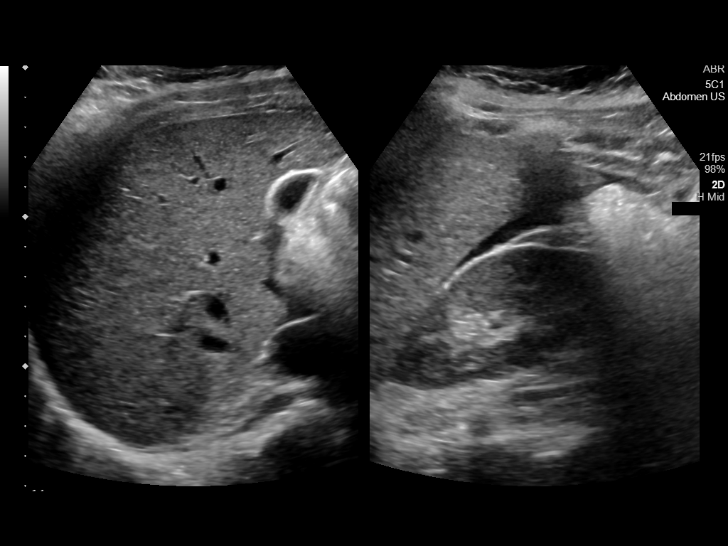

[14 of 25 positions shown; findings below may reference images not displayed]

FINDINGS: Gallbladder:

No gallstones or wall thickening visualized. No sonographic Murphy
sign noted by sonographer.

Common bile duct:

Diameter: 3 mm which is within normal limits.

Liver:

No focal lesion identified. Within normal limits in parenchymal
echogenicity. Portal vein is patent on color Doppler imaging with
normal direction of blood flow towards the liver.

Other: Small amount of free fluid is noted around the liver.
IMPRESSION: Small amount of free fluid is noted around the liver. No other
abnormality is noted in the right upper quadrant of the abdomen.

## 2021-10-04 ENCOUNTER — Emergency Department
Admission: EM | Admit: 2021-10-04 | Discharge: 2021-10-04 | Disposition: A | Discharge status: Home / Self Care | Payer: Managed Care, Other (non HMO) | Attending: Emergency Medicine | Admitting: Emergency Medicine

## 2021-10-04 ENCOUNTER — Other Ambulatory Visit: Payer: Self-pay

## 2021-10-04 DIAGNOSIS — Y93G3 Activity, cooking and baking: Secondary | ICD-10-CM | POA: Diagnosis not present

## 2021-10-04 DIAGNOSIS — W260XXA Contact with knife, initial encounter: Secondary | ICD-10-CM | POA: Diagnosis not present

## 2021-10-04 DIAGNOSIS — Z23 Encounter for immunization: Secondary | ICD-10-CM | POA: Diagnosis not present

## 2021-10-04 DIAGNOSIS — S61211A Laceration without foreign body of left index finger without damage to nail, initial encounter: Secondary | ICD-10-CM | POA: Diagnosis not present

## 2021-10-04 DIAGNOSIS — S6992XA Unspecified injury of left wrist, hand and finger(s), initial encounter: Secondary | ICD-10-CM | POA: Diagnosis present

## 2021-10-04 MED ORDER — TETANUS-DIPHTH-ACELL PERTUSSIS 5-2.5-18.5 LF-MCG/0.5 IM SUSY
0.5000 mL | PREFILLED_SYRINGE | Freq: Once | INTRAMUSCULAR | Status: AC
  Administered 2021-10-04: 19:00:00 0.5 mL via INTRAMUSCULAR
  Filled 2021-10-04: qty 0.5

## 2021-10-04 NOTE — ED Provider Notes (Signed)
[facility] Emergency Department Provider Note  ____________________________________________  Time seen: Approximately 6:42 PM  I have reviewed the triage vital signs and the nursing notes.   HISTORY  Chief Complaint Laceration    HPI Brooke Love is a 20 y.o. female who presents to the emergency department for laceration to the index finger of the left hand.  Patient was using a knife to cut open a package while preparing dinner when the knife slipped.  The patient sustained a laceration to the finger pad of the index finger left hand.  Bleeding was controlled direct pressure.  Unsure last tetanus shot.  No other injury or complaint at this time.       Past Medical History:  Diagnosis Date   No pertinent past medical history     There are no problems to display for this patient.   Past Surgical History:  Procedure Laterality Date   TONSILLECTOMY      Prior to Admission medications   Medication Sig Start Date End Date Taking? Authorizing Provider  medroxyPROGESTERone (PROVERA) 10 MG tablet Take 1 tablet (10 mg total) by mouth daily. Use for ten days 02/23/21   Hildred Laser, MD  Multiple Vitamins-Minerals (MULTIPLE VITAMINS/WOMENS PO) Take by mouth.    [provider]    Allergies Amoxicillin  Family History  Problem Relation Age of Onset   Healthy Mother    Healthy Father    Cancer Maternal Grandmother    Breast cancer Maternal Grandmother     Social History Social History   Tobacco Use   Smoking status: Never   Smokeless tobacco: Never  Vaping Use   Vaping Use: Every day   Substances: Nicotine  Substance Use Topics   Alcohol use: Yes    Comment: occass   Drug use: Not Currently    Types: Marijuana     Review of Systems  Constitutional: No fever/chills Eyes: No visual changes. No discharge ENT: No upper respiratory complaints. Cardiovascular: no chest pain. Respiratory: no cough. No SOB. Gastrointestinal: No  abdominal pain.  No nausea, no vomiting.  No diarrhea.  No constipation. Musculoskeletal: Laceration to the index finger of the left hand Skin: Negative for rash, abrasions, lacerations, ecchymosis. Neurological: Negative for headaches, focal weakness or numbness.  10 System ROS otherwise negative.  ____________________________________________   PHYSICAL EXAM:  VITAL SIGNS: ED Triage Vitals [10/04/21 1628]  Enc Vitals Group     BP      Pulse      Resp      Temp      Temp src      SpO2      Weight      Height      Head Circumference      Peak Flow      Pain Score 4     Pain Loc      Pain Edu?      Excl. in GC?      Constitutional: Alert and oriented. Well appearing and in no acute distress. Eyes: Conjunctivae are normal. PERRL. EOMI. Head: Atraumatic. ENT:      Ears:       Nose: No congestion/rhinnorhea.      Mouth/Throat: Mucous membranes are moist.  Neck: No stridor.    Cardiovascular: Normal rate, regular rhythm. Normal S1 and S2.  Good peripheral circulation. Respiratory: Normal respiratory effort without tachypnea or retractions. Lungs CTAB. Good air entry to the bases with no decreased or absent breath sounds. Musculoskeletal: Full range  of motion to all extremities. No gross deformities appreciated.  Visualization of the left and neck finger reveals a small flap laceration with edges well approximated.  Laceration with is less than 0.5 cm and total circumference of flap is roughly 0.75 cm.  Again edges are approximated.  No bleeding.  Full range of motion to the digit. Neurologic:  Normal speech and language. No gross focal neurologic deficits are appreciated.  Skin:  Skin is warm, dry and intact. No rash noted. Psychiatric: Mood and affect are normal. Speech and behavior are normal. Patient exhibits appropriate insight and judgement.   ____________________________________________   LABS (all labs ordered are listed, but only abnormal results are  displayed)  Labs Reviewed - No data to display ____________________________________________  EKG   ____________________________________________  RADIOLOGY   No results found.  ____________________________________________    PROCEDURES  Procedure(s) performed:    Marland KitchenMarland KitchenLaceration Repair  Date/Time: 10/04/2021 7:18 PM Performed by: Racheal Patches, PA-C Authorized by: Racheal Patches, PA-C   Consent:    Consent obtained:  Verbal   Consent given by:  Patient   Risks discussed:  Infection and pain Universal protocol:    Procedure explained and questions answered to patient or proxy's satisfaction: yes     Immediately prior to procedure, a time out was called: yes     Patient identity confirmed:  Verbally with patient Anesthesia:    Anesthesia method:  None Laceration details:    Location:  Finger   Finger location:  L index finger   Length (cm):  0.5 Exploration:    Imaging outcome: foreign body not noted     Wound exploration: wound explored through full range of motion and entire depth of wound visualized     Wound extent: no foreign bodies/material noted, no nerve damage noted, no tendon damage noted and no vascular damage noted   Treatment:    Area cleansed with:  Shur-Clens   Amount of cleaning:  Standard Skin repair:    Repair method:  Tissue adhesive Approximation:    Approximation:  Close Repair type:    Repair type:  Simple Post-procedure details:    Dressing:  Open (no dressing)   Procedure completion:  Tolerated well, no immediate complications    Medications  Tdap (BOOSTRIX) injection 0.5 mL (0.5 mLs Intramuscular Given 10/04/21 1914)     ____________________________________________   INITIAL IMPRESSION / ASSESSMENT AND PLAN / ED COURSE  Pertinent labs & imaging results that were available during my care of the patient were reviewed by me and considered in my medical decision making (see chart for details).  Review of the [place] was performed in accordance of the [facility] prior to dispensing any controlled drugs.           Patient's diagnosis is consistent with finger laceration.  Patient presents the emergency department after sustaining a laceration to the index finger.  Patient had a small flap-like laceration that was amenable to closure with glue.  This was performed here in the emergency department.  Tetanus shot updated.  Wound care instructions discussed with the patient.  Follow-up with primary care as needed. Patient is given ED precautions to return to the ED for any worsening or new symptoms.     ____________________________________________  FINAL CLINICAL IMPRESSION(S) / ED DIAGNOSES  Final diagnoses:  Laceration of left index finger without foreign body without damage to nail, initial encounter      NEW MEDICATIONS STARTED DURING THIS VISIT:  ED Discharge Orders  None           This chart was dictated using voice recognition software/Dragon. Despite best efforts to proofread, errors can occur which can change the meaning. Any change was purely unintentional.    Racheal Patches, PA-C 10/04/21 1920    Delton Prairie, MD 10/04/21 2330

## 2021-10-04 NOTE — ED Triage Notes (Signed)
Pt comes with c/o finger laceration to left pointer finger. Pt states she was cutting package open and cut herself with knife.

## 2021-10-04 NOTE — ED Provider Notes (Signed)
Emergency Medicine Provider Triage Evaluation Note  Brooke Love , a 20 y.o. female  was evaluated in triage.  Pt complains of finger laceration to left index. She accidentally cut herself while opening a package.  Review of Systems  Positive: Finger laceration Negative: Decreased sensation  Physical Exam  There were no vitals taken for this visit. Gen:   Awake, no distress   Resp:  Normal effort  MSK:   Moves extremities without difficulty  Other:    Medical Decision Making  Medically screening exam initiated at 4:28 PM.  Appropriate orders placed.  Brooke A Aaronson was informed that the remainder of the evaluation will be completed by another provider, this initial triage assessment does not replace that evaluation, and the importance of remaining in the ED until their evaluation is complete.   Chinita Pester, FNP 10/04/21 1630    Arnaldo Natal, MD 10/04/21 [phone]

## 2024-10-28 ENCOUNTER — Other Ambulatory Visit: Payer: Self-pay

## 2024-10-28 ENCOUNTER — Emergency Department
Admission: EM | Admit: 2024-10-28 | Discharge: 2024-10-28 | Disposition: A | Discharge status: Home / Self Care | Attending: Emergency Medicine | Admitting: Emergency Medicine

## 2024-10-28 ENCOUNTER — Encounter: Payer: Self-pay | Admitting: Emergency Medicine

## 2024-10-28 DIAGNOSIS — J069 Acute upper respiratory infection, unspecified: Secondary | ICD-10-CM | POA: Insufficient documentation

## 2024-10-28 DIAGNOSIS — R059 Cough, unspecified: Secondary | ICD-10-CM | POA: Diagnosis present

## 2024-10-28 MED ORDER — PSEUDOEPH-BROMPHEN-DM 30-2-10 MG/5ML PO SYRP: 5.0000 mL | ORAL_SOLUTION | Freq: Four times a day (QID) | ORAL | 0 refills | Status: AC | PRN

## 2024-10-28 NOTE — ED Provider Notes (Signed)
 "  [facility] Provider Note    Event Date/Time   First MD Initiated Contact with Patient 10/28/24 2215     (approximate)   History   Nasal Congestion    HPI  Brooke Love is a 24 y.o. female   with no significant past medical history who presents to the ED complaining of sore throat, cough, chest congestion. According to the patient, symptoms started 5 days ago with productive fever, nasal congestion, unquantified fever.  Patient denies chills, chest pain, abdominal pain, diarrhea, vomit, urinary symptoms.  Patient is here by herself.     There are no active problems to display for this patient.    Physical Exam   Triage Vital Signs: ED Triage Vitals  Encounter Vitals Group     BP 10/28/24 2142 131/84     Girls Systolic BP Percentile --      Girls Diastolic BP Percentile --      Boys Systolic BP Percentile --      Boys Diastolic BP Percentile --      Pulse Rate 10/28/24 2142 96     Resp 10/28/24 2142 18     Temp 10/28/24 2142 99.4 F (37.4 C)     Temp Source 10/28/24 2142 Oral     SpO2 10/28/24 2142 98 %     Weight 10/28/24 2141 160 lb (72.6 kg)     Height 10/28/24 2141 4' 11 (1.499 m)     Head Circumference --      Peak Flow --      Pain Score 10/28/24 2141 3     Pain Loc --      Pain Education --      Exclude from Growth Chart --     Most recent vital signs: Vitals:   10/28/24 2142  BP: 131/84  Pulse: 96  Resp: 18  Temp: 99.4 F (37.4 C)  SpO2: 98%     Physical Exam Vitals and nursing note reviewed.  In triage vital signs were normal.  General:          Awake, no distress.  Nasal congestion Throat: No peritonsillar erythema, no tonsillar enlargement CV:                  Good peripheral perfusion. Regular rate and rhythm. Resp:               Normal effort. no tachypnea.Equal breath sounds bilaterally.  No wheezing, no crackles no rales. Abd:                 No distention.  Soft nontender Other:               ED Results /  Procedures / Treatments   Labs (all labs ordered are listed, but only abnormal results are displayed) Labs Reviewed - No data to display     RADIOLOGY I independently reviewed and interpreted imaging and agree with radiologists findings.      PROCEDURES:  Critical Care performed:   Procedures   MEDICATIONS ORDERED IN ED: Medications - No data to display    IMPRESSION / MDM / ASSESSMENT AND PLAN / ED COURSE  I reviewed the triage vital signs and the nursing notes.  Differential diagnosis includes, but is not limited to, viral infection, pneumonia, bronchitis  Patient's presentation is most consistent with acute, uncomplicated illness.   Brooke Love is a 24 y.o., female who presents today with history of 5 days of cough, nasal  congestion.  See HPI for further information.  On a physical exam vital signs were normal during triage.  Throat within normal limits, cardiopulmonary is clear no wheezing no rales no crackles.  Abdomen is soft nontender nondistended. Patient's diagnosis is consistent with viral illness. I did not order any imaging or labs, physical exam was reassuring.. I did review the patient's allergies and medications.The patient is in stable and satisfactory condition for discharge home  Patient will be discharged home with prescriptions for cough syrup.  Did advise patient to drink plenty fluids, to take DayQuil, check temperature with thermometer. Patient is to follow up with PCP as needed or otherwise directed. Patient is given ED precautions to return to the ED for any worsening or new symptoms.  Work note will be provided. Discussed plan of care with patient, answered all of patient's questions, patient agreeable to plan of care. Advised patient to take medications according to the instructions on the label. Discussed possible side effects of new medications. Patient verbalized understanding.  FINAL CLINICAL IMPRESSION(S) / ED DIAGNOSES   Final diagnoses:   Viral URI with cough     Rx / DC Orders   ED Discharge Orders          Ordered    brompheniramine-pseudoephedrine-DM 30-2-10 MG/5ML syrup  4 times daily PRN        10/28/24 2231             Note:  This document was prepared using Dragon voice recognition software and may include unintentional dictation errors.   Janit Kast, PA-C 10/28/24 2231    Viviann Pastor, MD 10/28/24 2258  "

## 2024-10-28 NOTE — ED Triage Notes (Signed)
 Pt presents to the ED via POV with complaints of sore throat due to post-nasal drainage, cough, and chest congestion that started  5 days ago. A&Ox4 at this time. Denies CP or SOB.

## 2024-10-28 NOTE — ED Notes (Signed)
 Pt given DC instructions. Pt verbalized understanding of medication and follow up care. Pt ambulatory from ED without difficulty.

## 2024-10-28 NOTE — Discharge Instructions (Signed)
 You have been diagnosed with viral upper respiratory infection with cough.  Please take cough syrup every 6 hours as needed for cough.  You can take DayQuil every 12 hours for nasal congestion.  Please drink plenty of fluids.  Please check temperature with thermometer.  Please come back to ED or go to your PCP if you have any symptoms symptoms worsen.  It was a pleasure to help you today.  Otis Portal, PA-C.
